# Patient Record
Sex: Female | Born: 1975 | Race: White | Hispanic: No | Marital: Single
Health system: Southern US, Community
[De-identification: ages and names within clinical notes are randomized; demographics above are authoritative.]

---

## 2018-06-24 ENCOUNTER — Other Ambulatory Visit: Payer: Self-pay | Admitting: Orthopedic Surgery

## 2018-06-24 DIAGNOSIS — M259 Joint disorder, unspecified: Secondary | ICD-10-CM

## 2018-07-14 ENCOUNTER — Ambulatory Visit
Admission: RE | Admit: 2018-07-14 | Discharge: 2018-07-14 | Disposition: A | Discharge status: Home / Self Care | Payer: BC Managed Care – PPO | Source: Ambulatory Visit | Attending: Orthopedic Surgery | Admitting: Orthopedic Surgery

## 2018-07-14 ENCOUNTER — Other Ambulatory Visit: Payer: Self-pay

## 2018-07-14 DIAGNOSIS — M259 Joint disorder, unspecified: Secondary | ICD-10-CM

## 2018-08-12 ENCOUNTER — Emergency Department (HOSPITAL_COMMUNITY): Payer: BC Managed Care – PPO

## 2018-08-12 ENCOUNTER — Other Ambulatory Visit: Payer: Self-pay

## 2018-08-12 ENCOUNTER — Encounter (HOSPITAL_COMMUNITY): Payer: Self-pay | Admitting: Emergency Medicine

## 2018-08-12 ENCOUNTER — Emergency Department (HOSPITAL_COMMUNITY)
Admission: EM | Admit: 2018-08-12 | Discharge: 2018-08-12 | Disposition: A | Discharge status: Home / Self Care | Payer: BC Managed Care – PPO | Attending: Emergency Medicine | Admitting: Emergency Medicine

## 2018-08-12 DIAGNOSIS — M25551 Pain in right hip: Secondary | ICD-10-CM | POA: Insufficient documentation

## 2018-08-12 MED ORDER — OXYCODONE-ACETAMINOPHEN 5-325 MG PO TABS: 1.0000 | ORAL_TABLET | Freq: Four times a day (QID) | ORAL | 0 refills | Status: AC | PRN

## 2018-08-12 MED ORDER — ONDANSETRON HCL 4 MG/2ML IJ SOLN
4.0000 mg | Freq: Once | INTRAMUSCULAR | Status: AC
  Administered 2018-08-12: 14:00:00 4 mg via INTRAVENOUS

## 2018-08-12 MED ORDER — MORPHINE SULFATE (PF) 4 MG/ML IV SOLN
4.0000 mg | Freq: Once | INTRAVENOUS | Status: AC
  Administered 2018-08-12: 4 mg via INTRAVENOUS
  Filled 2018-08-12: qty 1

## 2018-08-12 NOTE — Discharge Instructions (Addendum)
Please read attached information. If you experience any new or worsening signs or symptoms please return to the emergency room for evaluation. Please follow-up with your primary care provider or specialist as discussed. Please use medication prescribed only as directed and discontinue taking if you have any concerning signs or symptoms.   °

## 2018-08-12 NOTE — ED Provider Notes (Signed)
[facility] Provider Note   CSN: [id number] Arrival date & time: 08/12/18  [id number]    History   Chief Complaint Chief Complaint  Patient presents with   Hip Pain    HPI Robin Ware is a 43 y.o. female.     HPI   43 year old female presents today with complaints of right hip pain.  Patient notes history of chronic back and hip pain.  She was getting out of the car today when she felt a pop in her right hip.  She notes she had her cane with her and was able to catch her fall.  She has not been able to bear weight on the leg since that time.  She notes it radiates from the hip down into the groin.  She notes range of motion at the hip is severely painful.  No loss of distal sensation strength motor function.  She notes she was receiving joint injections for this hip pain.  [facility].  She notes she has been using Tylenol for the pain.  She works as a Engineer, civil (consulting)nurse.    History reviewed. No pertinent past medical history.  There are no active problems to display for this patient.   History reviewed. No pertinent surgical history.   OB History   No obstetric history on file.      Home Medications    Prior to Admission medications   Medication Sig Start Date End Date Taking? Authorizing Provider  oxyCODONE-acetaminophen (PERCOCET/ROXICET) 5-325 MG tablet Take 1 tablet by mouth every 6 (six) hours as needed. 08/12/18   Eyvonne MechanicHedges, Ledon Weihe, PA-C    Family History No family history on file.  Social History Social History   Tobacco Use   Smoking status: Not on file  Substance Use Topics   Alcohol use: Not on file   Drug use: Not on file     Allergies   Codeine   Review of Systems Review of Systems  All other systems reviewed and are negative.   Physical Exam Updated Vital Signs BP 138/72 (BP Location: Left Arm)    Pulse 73    Temp 98.2 F (36.8 C) (Oral)    Resp 19    SpO2  94%   Physical Exam Vitals signs and nursing note reviewed.  Constitutional:      Appearance: She is well-developed.  HENT:     Head: Normocephalic and atraumatic.  Eyes:     General: No scleral icterus.       Right eye: No discharge.        Left eye: No discharge.     Conjunctiva/sclera: Conjunctivae normal.     Pupils: Pupils are equal, round, and reactive to light.  Neck:     Musculoskeletal: Normal range of motion.     Vascular: No JVD.     Trachea: No tracheal deviation.  Pulmonary:     Effort: Pulmonary effort is normal.     Breath sounds: No stridor.  Musculoskeletal:     Comments: Right hip with exquisite tenderness to palpation of the lateral and anterior aspect, no swelling noted to the right lower extremity sensation intact, limited range of motion at the hip secondary to pain  Neurological:     Mental Status: She is alert and oriented to person, place, and time.     Coordination: Coordination normal.  Psychiatric:        Behavior: Behavior normal.  Thought Content: Thought content normal.        Judgment: Judgment normal.      ED Treatments / Results  Labs (all labs ordered are listed, but only abnormal results are displayed) Labs Reviewed - No data to display  EKG None  Radiology Ct Hip Right Wo Contrast  Result Date: 08/12/2018 CLINICAL DATA:  Right hip pain. Patient felt a pop walking. Suspected fracture. EXAM: CT OF THE RIGHT HIP WITHOUT CONTRAST TECHNIQUE: Multidetector CT imaging of the right hip was performed according to the standard protocol. Multiplanar CT image reconstructions were also generated. COMPARISON:  Right hip radiographs 08/12/2018. FINDINGS: Bones/Joint/Cartilage No evidence of acute fracture or dislocation. As noted on the earlier radiographs, there is severe right hip arthropathy with superolateral migration of the humeral head, marked joint space narrowing, osteophyte and subchondral cyst formation in the femoral head and  superior acetabulum. Typical findings of humeral head avascular necrosis are not demonstrated. There is a small hip joint effusion. No evidence of loose body. Ligaments Suboptimally assessed by CT. Muscles and Tendons Unremarkable. Soft tissues No focal periarticular fluid collection or foreign body. The visualized internal pelvic contents appear unremarkable. IMPRESSION: 1. No evidence of acute fracture or dislocation about the right hip. 2. Severe right hip arthropathy for age without definite underlying avascular necrosis. These findings could be secondary to CPPD arthropathy or osteoarthritis. Electronically Signed   By: Carey BullocksWilliam  Veazey M.D.   On: 08/12/2018 15:12   Dg Hip Unilat W Or Wo Pelvis 2-3 Views Right  Result Date: 08/12/2018 CLINICAL DATA:  43 year old female with acute hip pain, fall. Arthritis of the right hip, unable to weightbear. EXAM: DG HIP (WITH OR WITHOUT PELVIS) 2-3V RIGHT COMPARISON:  Pelvis CT 07/14/2018. FINDINGS: Severe right hip joint space loss, bone on bone appearance. Associated subchondral sclerosis and lucency at both the femoral head and acetabulum. Cortical irregularity of the femoral head. No acute fracture of the proximal right femur. The contralateral left hip joint space appears normal. There is mild left side acetabular and femoral head spurring. Intact pelvis. SI joints appear normal. Negative visible lower abdominal and pelvic visceral contours. IMPRESSION: 1. 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 2.  No acute osseous abnormality identified. Electronically Signed   By: Odessa FlemingH  Hall M.D.   On: 08/12/2018 12:03    Procedures Procedures (including critical care time)  Medications Ordered in ED Medications  morphine 4 MG/ML injection 4 mg (4 mg Intravenous Given 08/12/18 1250)  ondansetron (ZOFRAN) injection 4 mg (4 mg Intravenous Given 08/12/18 1336)  morphine 4 MG/ML injection 4 mg (4 mg Intravenous Given 08/12/18 1859)     Initial Impression /  Assessment and Plan / ED Course  I have reviewed the triage vital signs and the nursing notes.  Pertinent labs & imaging results that were available during my care of the patient were reviewed by me and considered in my medical decision making (see chart for details).        Labs:   Imaging: DG hip right, CT hip right  Consults: Case management, physical therapy  Therapeutics: Morphine  Discharge Meds:   Assessment/Plan:   43 year old female presents today with right hip pain.  Patient's initial plain films show no acute fracture.  Patient reports she was unable to ambulate on this.  Concern for occult fracture.  CT scan ordered with no acute fracture.  Patient does live at home with her daughter.  I discussed her options patient would like to go  home.  PT evaluated patient at bedside, she was given a bedside commode and a walker.  Patient discharged home with pain medicine she has not a follow-up appointment with orthopedics tomorrow.  She will return if she develops any new or worsening signs or symptoms.  She verbalized understanding and agreement to today's plan had no further questions or concerns at time of discharge.    Final Clinical Impressions(s) / ED Diagnoses   Final diagnoses:  Pain of right hip joint    ED Discharge Orders         Ordered    oxyCODONE-acetaminophen (PERCOCET/ROXICET) 5-325 MG tablet  Every 6 hours PRN     08/12/18 1859           Francee Gentile 08/13/18 1515    Jola Schmidt, MD 08/15/18 2238

## 2018-08-12 NOTE — TOC Transition Note (Signed)
Transition of Care [facility]) - CM/SW Discharge Note   Patient Details  Name: Robin Ware MRN: [id number] Date of Birth: 23-Jul-1975  [facility]) CM/SW Contact:  Fuller Mandril, RN Phone Number: 08/12/2018, 3:55 PM   Clinical Narrative:     [facility] consulted regarding DME for pt.  Final next level of care: Home/Self Care Barriers to Discharge: Barriers Resolved   Patient Goals and CMS Choice Patient states their goals for this hospitalization and ongoing recovery are:: get rid of this hip pain      Discharge Placement                       Discharge Plan and Services   Discharge Planning Services: CM Consult Post Acute Care Choice: Durable Medical Equipment          DME Arranged: Bedside commode, Walker rolling DME Agency: [employer] Date DME Agency Contacted: 08/12/18 Time DME Agency Contacted: [phone] Representative spoke with at DME Agency: [facility] met with pt at bedside.  Pt states she is arranging ortho appointment ASAP for further work-up.  EDCM arranged rolling walker and BSC to be delivered to pt room prior to discharge home today.      Social Determinants of Health (SDOH) Interventions     Readmission Risk Interventions No flowsheet data found.

## 2018-08-12 NOTE — ED Triage Notes (Signed)
Pt  Here from home with c/o right hip pain , after she was walking and felt a pop , pt has been told she needs a hip replacement

## 2018-08-12 NOTE — Evaluation (Signed)
Physical Therapy Evaluation Patient Details Name: Robin Ware MRN: [id number] DOB: 03-Aug-1975 Today's Date: 08/12/2018   History of Present Illness  Pt is a 43 y/o female presenting to ED with R hip pain following near fall. Pt reports hearing pop in R hip. CT negative for acute abnormality. Pt with no pertinent PMH.   Clinical Impression  Pt presenting with problem above and deficits below. Pt limited in gait tolerance secondary to pain, however, was able to ambulate to bathroom and back to bed using RW. Pt with very limited weightshift to RLE. Educated about use of RW at home to increase safety with mobility. Verbally reviewed stair navigation at home as pt with 2 flights of steps. Pt currently refusing HHPT, although I feel she could benefit from some PT follow up. Pt reports daughter can assist as needed at d/c. Will continue to follow acutely to maximize functional mobility independence and safety.     Follow Up Recommendations Other (comment);Supervision for mobility/OOB(pt refusing HHPT )    Equipment Recommendations  Rolling walker with 5" wheels;3in1 (PT)    Recommendations for Other Services       Precautions / Restrictions Precautions Precautions: Fall Restrictions Weight Bearing Restrictions: No      Mobility  Bed Mobility Overal bed mobility: Needs Assistance Bed Mobility: Supine to Sit;Sit to Supine     Supine to sit: Supervision Sit to supine: Supervision   General bed mobility comments: Supervision and increased time required to come to sitting.   Transfers Overall transfer level: Needs assistance Equipment used: Rolling walker (2 wheeled) Transfers: Sit to/from Stand Sit to Stand: Min guard         General transfer comment: Min guard for safety. Cues for safe hand placement. Pt with limited weightshift to RLE to stand.   Ambulation/Gait Ambulation/Gait assistance: Min guard Gait Distance (Feet): 20 Feet Assistive device: Rolling walker (2  wheeled) Gait Pattern/deviations: Step-to pattern;Decreased step length - right;Decreased step length - left;Decreased weight shift to right;Antalgic Gait velocity: Decreased   General Gait Details: Slow, very antalgic gait. Limited weightshift to RLE secondary to pain. Cues for sequencing using RW. Distance limited to bathroom and back secondary to pain.   Stairs Stairs: Yes       General stair comments: Verbally reviewed safe stair technique with pt and pt's daughter, as there was no steps to practice on in ED. Educated about LE sequencing and guarding techniques for those helping pt ascend and descend steps.   Wheelchair Mobility    Modified Rankin (Stroke Patients Only)       Balance Overall balance assessment: Needs assistance Sitting-balance support: No upper extremity supported;Feet supported Sitting balance-Leahy Scale: Fair     Standing balance support: Bilateral upper extremity supported;During functional activity Standing balance-Leahy Scale: Poor Standing balance comment: Reliant on BUE support                              Pertinent Vitals/Pain Pain Assessment: 0-10 Pain Score: 7  Pain Location: R hip  Pain Descriptors / Indicators: Aching;Grimacing;Guarding Pain Intervention(s): Limited activity within patient's tolerance;Monitored during session;Repositioned    Home Living Family/patient expects to be discharged to:: Private residence Living Arrangements: Children Available Help at Discharge: Family;Available 24 hours/day Type of Home: Apartment Home Access: Stairs to enter Entrance Stairs-Rails: Left;Right(1 on one flight and bilateral rail on 2nd flight) Entrance Stairs-Number of Steps: 2 flights Home Layout: One level Home Equipment: Cane - single point  Prior Function Level of Independence: Independent with assistive device(s)         Comments: Has been using cane for ambulation      Hand Dominance         Extremity/Trunk Assessment   Upper Extremity Assessment Upper Extremity Assessment: Overall WFL for tasks assessed    Lower Extremity Assessment Lower Extremity Assessment: RLE deficits/detail RLE Deficits / Details: Limited ROM secondary to pain. Only able to perform partial heel slide. Limited weightshift to RLE during mobility.     Cervical / Trunk Assessment Cervical / Trunk Assessment: Normal  Communication   Communication: No difficulties  Cognition Arousal/Alertness: Awake/alert Behavior During Therapy: WFL for tasks assessed/performed Overall Cognitive Status: Within Functional Limits for tasks assessed                                        General Comments General comments (skin integrity, edema, etc.): Pt's daughter present throughout session.     Exercises     Assessment/Plan    PT Assessment Patient needs continued PT services  PT Problem List Decreased strength;Decreased activity tolerance;Decreased range of motion;Decreased balance;Decreased mobility;Decreased knowledge of use of DME;Pain       PT Treatment Interventions DME instruction;Gait training;Stair training;Functional mobility training;Therapeutic activities;Therapeutic exercise;Balance training;Patient/family education    PT Goals (Current goals can be found in the Care Plan section)  Acute Rehab PT Goals Patient Stated Goal: to decrease pain in R hip  PT Goal Formulation: With patient Time For Goal Achievement: 08/26/18 Potential to Achieve Goals: Good    Frequency Min 3X/week   Barriers to discharge Inaccessible home environment      Co-evaluation               AM-PAC PT "6 Clicks" Mobility  Outcome Measure Help needed turning from your back to your side while in a flat bed without using bedrails?: None Help needed moving from lying on your back to sitting on the side of a flat bed without using bedrails?: A Little Help needed moving to and from a bed to a chair  (including a wheelchair)?: A Little Help needed standing up from a chair using your arms (e.g., wheelchair or bedside chair)?: A Little Help needed to walk in hospital room?: A Little Help needed climbing 3-5 steps with a railing? : A Lot 6 Click Score: 18    End of Session   Activity Tolerance: Patient limited by pain Patient left: in bed;with call bell/phone within reach;with family/visitor present Nurse Communication: Mobility status PT Visit Diagnosis: Muscle weakness (generalized) (M62.81);Difficulty in walking, not elsewhere classified (R26.2);Pain Pain - Right/Left: Right Pain - part of body: Hip    Time: [id number] PT Time Calculation (min) (ACUTE ONLY): 21 min   Charges:   PT Evaluation $PT Eval Moderate Complexity: 1 Mod          Gladys DammeBrittany Meridith Romick, PT, DPT  Acute Rehabilitation Services  Pager: [phone] Office: [phone]   Lehman PromBrittany S Luismiguel Lamere 08/12/2018, 4:36 PM

## 2020-05-22 ENCOUNTER — Other Ambulatory Visit (HOSPITAL_COMMUNITY): Payer: Self-pay | Admitting: Physician Assistant

## 2020-05-22 ENCOUNTER — Other Ambulatory Visit: Payer: Self-pay

## 2020-05-22 ENCOUNTER — Ambulatory Visit (HOSPITAL_COMMUNITY)
Admission: RE | Admit: 2020-05-22 | Discharge: 2020-05-22 | Disposition: A | Discharge status: Home / Self Care | Payer: BC Managed Care – PPO | Source: Ambulatory Visit | Attending: Internal Medicine | Admitting: Internal Medicine

## 2020-05-22 DIAGNOSIS — M79662 Pain in left lower leg: Secondary | ICD-10-CM

## 2020-05-22 DIAGNOSIS — M7989 Other specified soft tissue disorders: Secondary | ICD-10-CM

## 2020-10-31 IMAGING — CT CT OF THE RIGHT HIP WITHOUT CONTRAST
2 of 3 series · 17 of 46 positions shown, 19 images · non-contrast
Comparison: Right hip radiographs 08/12/2018.

CLINICAL DATA: Right hip pain. Patient felt a pop walking.
Suspected fracture.

EXAM:
CT OF THE RIGHT HIP WITHOUT CONTRAST
TECHNIQUE: Multidetector CT imaging of the right hip was performed according to
the standard protocol. Multiplanar CT image reconstructions were
also generated.

[Series 3: right hip 2.0 st · axial · 0.56mm/px · z∈[+712,+918]mm · 14 of 119 slices shown, 16 images]
[im 8/119  soft-tissue]
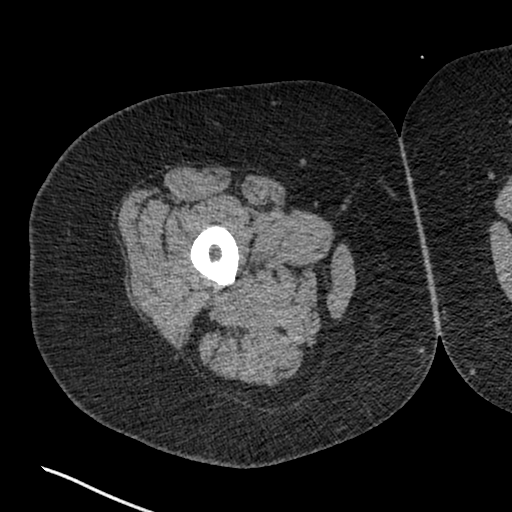
[im 8/119  bone]
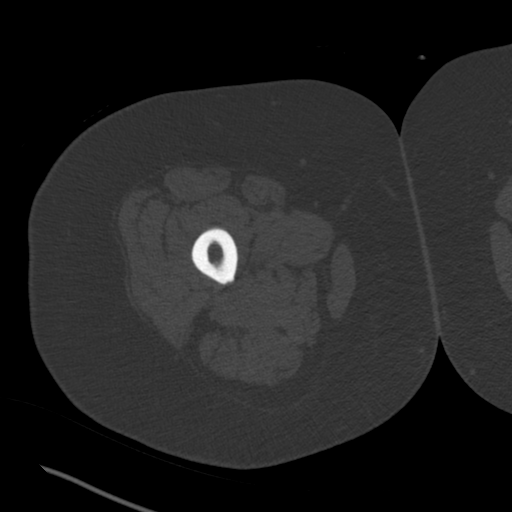
[im 16/119  soft-tissue]
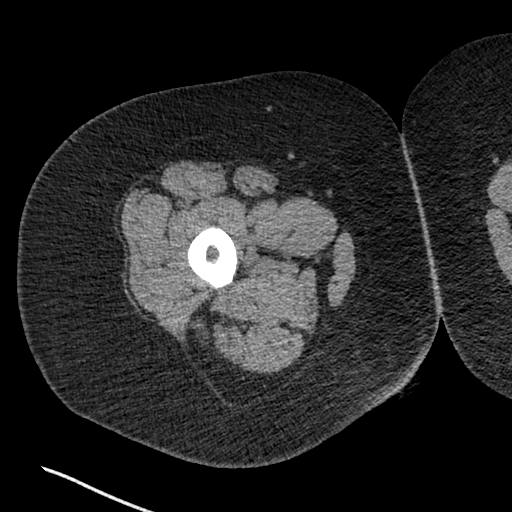
[im 23/119  soft-tissue]
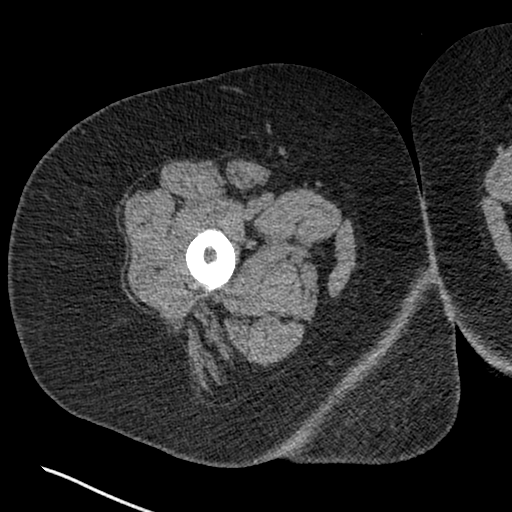
[im 31/119  soft-tissue]
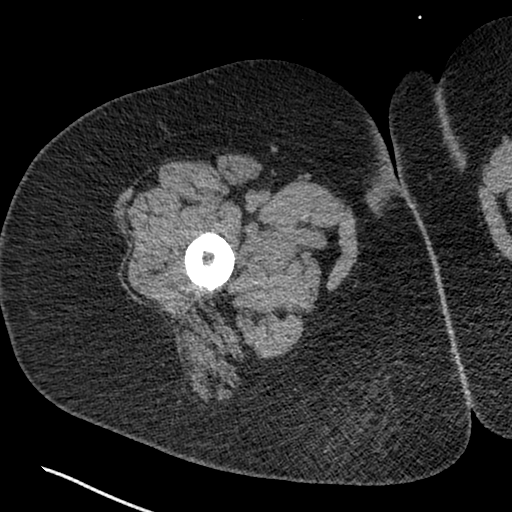
[im 39/119  soft-tissue]
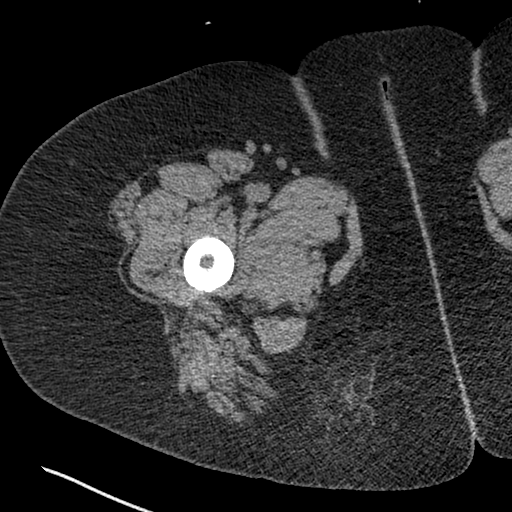
[im 46/119  soft-tissue]
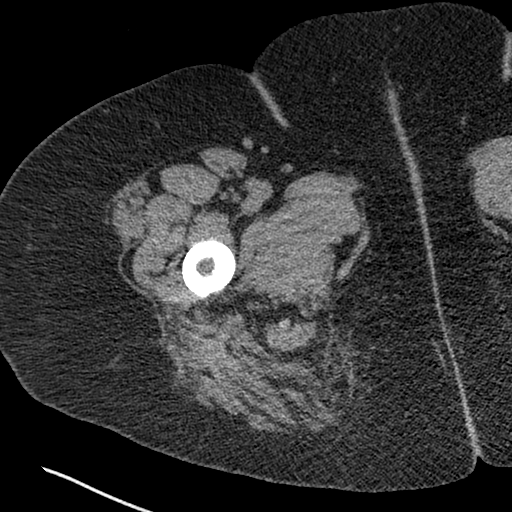
[im 54/119  soft-tissue]
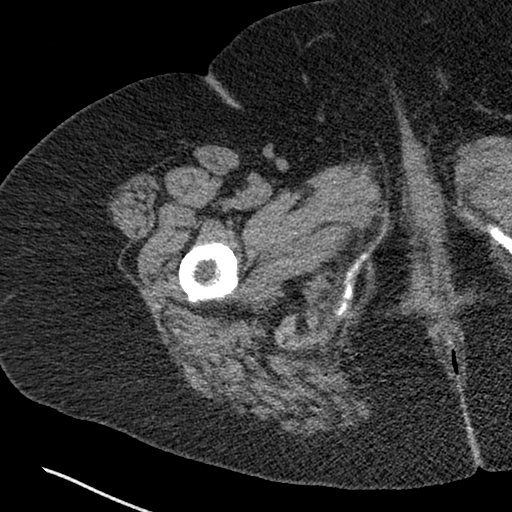
[im 65/119  soft-tissue]
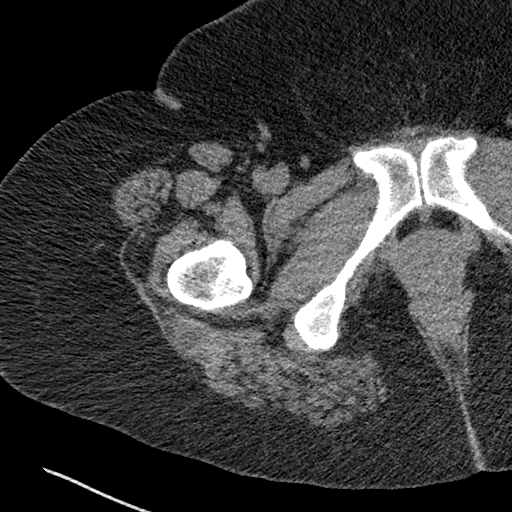
[im 73/119  soft-tissue]
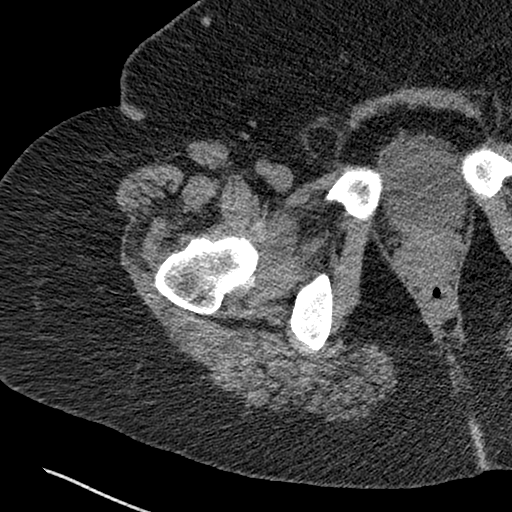
[im 73/119  bone]
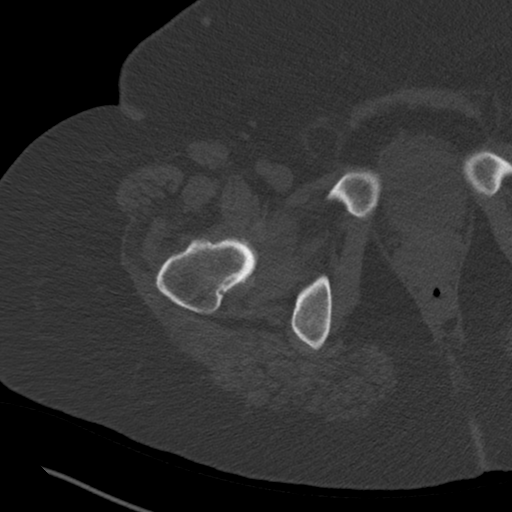
[im 80/119  soft-tissue]
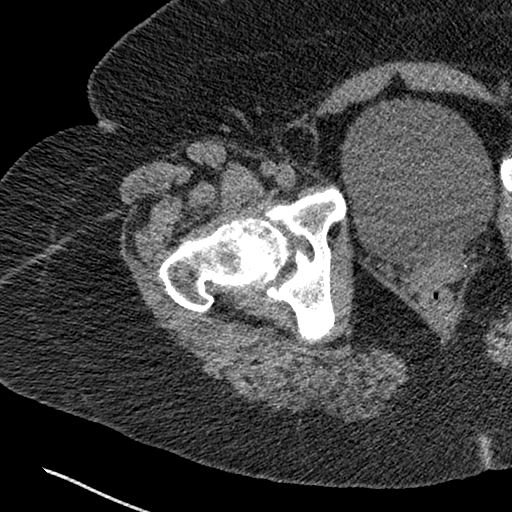
[im 88/119  soft-tissue]
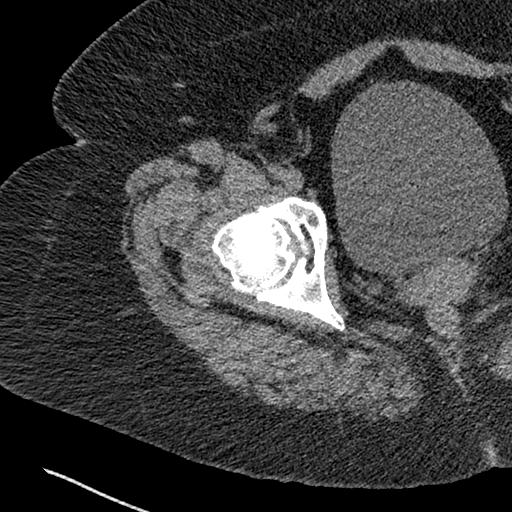
[im 96/119  soft-tissue]
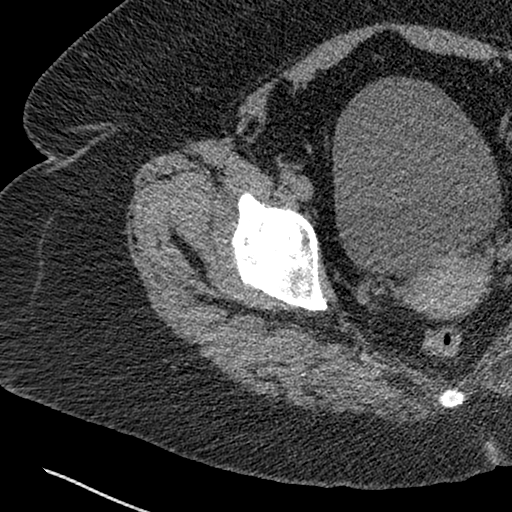
[im 103/119  soft-tissue]
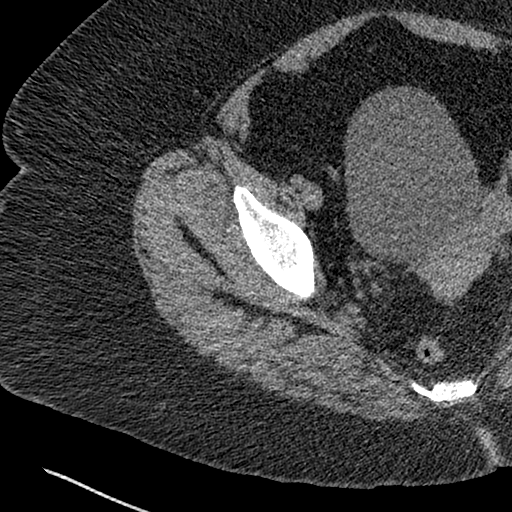
[im 111/119  soft-tissue]
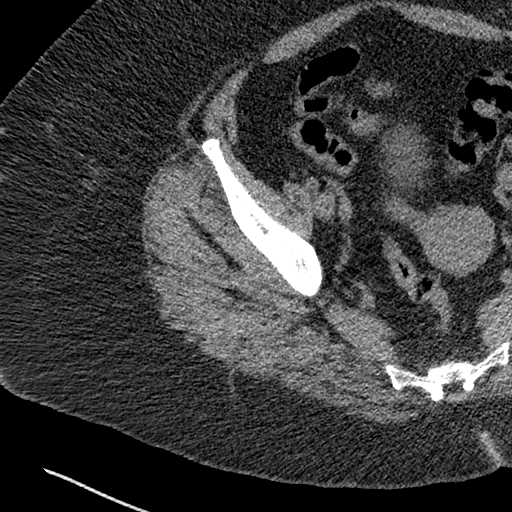

[Series 8: coronal st · coronal · 0.48mm/px · 3 of 162 slices shown]
[im 54/162  soft-tissue]
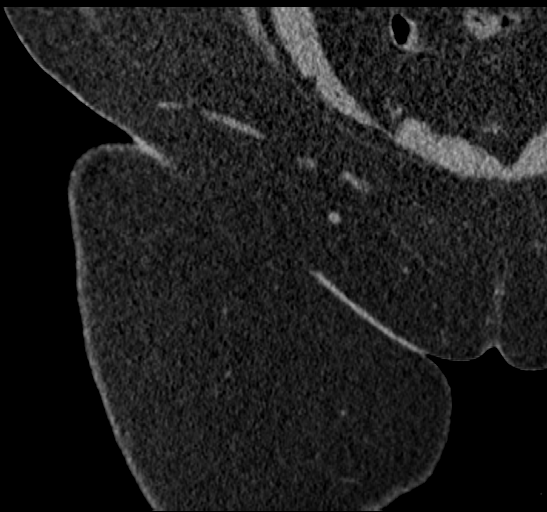
[im 72/162  soft-tissue]
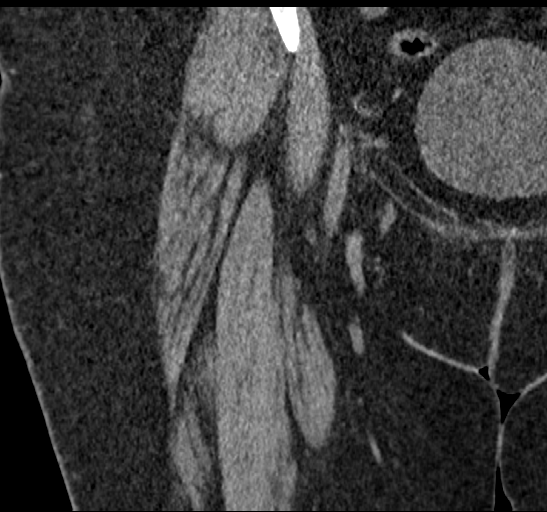
[im 90/162  soft-tissue]
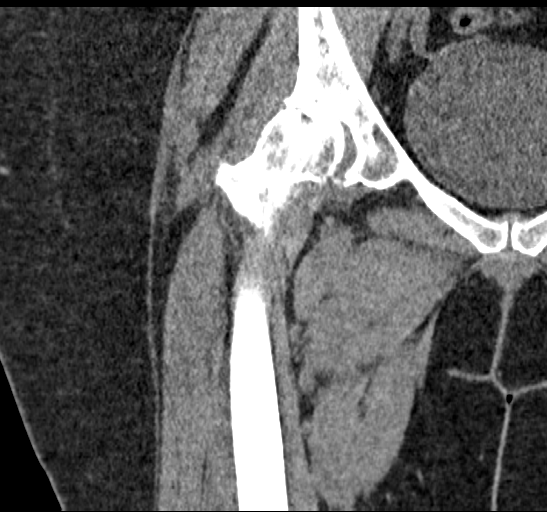

[17 of 46 positions shown; findings below may reference images not displayed]

FINDINGS: Bones/Joint/Cartilage

No evidence of acute fracture or dislocation. As noted on the
earlier radiographs, there is severe right hip arthropathy with
superolateral migration of the humeral head, marked joint space
narrowing, osteophyte and subchondral cyst formation in the femoral
head and superior acetabulum. Typical findings of humeral head
avascular necrosis are not demonstrated. There is a small hip joint
effusion. No evidence of loose body.

Ligaments

Suboptimally assessed by CT.

Muscles and Tendons

Unremarkable.

Soft tissues

No focal periarticular fluid collection or foreign body. The
visualized internal pelvic contents appear unremarkable.
IMPRESSION: 1. No evidence of acute fracture or dislocation about the right hip.
2. Severe right hip arthropathy for age without definite underlying
avascular necrosis. These findings could be secondary to CPPD
arthropathy or osteoarthritis.

## 2023-12-30 ENCOUNTER — Ambulatory Visit (HOSPITAL_COMMUNITY): Admitting: Nurse Practitioner

## 2024-02-20 ENCOUNTER — Other Ambulatory Visit (HOSPITAL_COMMUNITY): Payer: Self-pay
